# Patient Record
Sex: Female | Born: 1935 | Race: White | Hispanic: No
Health system: Southern US, Community
[De-identification: ages and names within clinical notes are randomized; demographics above are authoritative.]

---

## 2006-07-12 ENCOUNTER — Ambulatory Visit: Payer: Self-pay | Admitting: Unknown Physician Specialty

## 2006-07-13 ENCOUNTER — Ambulatory Visit: Payer: Self-pay | Admitting: Unknown Physician Specialty

## 2007-09-07 ENCOUNTER — Ambulatory Visit: Payer: Self-pay | Admitting: Unknown Physician Specialty

## 2007-09-14 ENCOUNTER — Emergency Department: Payer: Self-pay | Admitting: Emergency Medicine

## 2008-12-18 ENCOUNTER — Ambulatory Visit: Payer: Self-pay | Admitting: Unknown Physician Specialty

## 2009-01-03 ENCOUNTER — Ambulatory Visit: Payer: Self-pay | Admitting: Unknown Physician Specialty

## 2009-06-18 ENCOUNTER — Ambulatory Visit: Payer: Self-pay | Admitting: Unknown Physician Specialty

## 2009-10-21 ENCOUNTER — Ambulatory Visit: Payer: Self-pay | Admitting: Specialist

## 2009-12-10 ENCOUNTER — Ambulatory Visit: Payer: Self-pay | Admitting: Unknown Physician Specialty

## 2010-02-02 ENCOUNTER — Emergency Department: Payer: Self-pay | Admitting: Emergency Medicine

## 2010-03-28 ENCOUNTER — Inpatient Hospital Stay: Payer: Self-pay | Admitting: Orthopedic Surgery

## 2010-03-28 ENCOUNTER — Other Ambulatory Visit: Payer: Self-pay | Admitting: Orthopedic Surgery

## 2010-04-06 ENCOUNTER — Emergency Department: Payer: Self-pay | Admitting: Emergency Medicine

## 2010-06-01 ENCOUNTER — Ambulatory Visit: Payer: Self-pay | Admitting: Internal Medicine

## 2010-06-28 ENCOUNTER — Inpatient Hospital Stay: Payer: Self-pay | Admitting: Orthopedic Surgery

## 2010-07-02 ENCOUNTER — Ambulatory Visit: Payer: Self-pay | Admitting: Internal Medicine

## 2010-12-27 ENCOUNTER — Ambulatory Visit: Payer: Self-pay

## 2011-02-16 ENCOUNTER — Emergency Department: Payer: Self-pay | Admitting: Unknown Physician Specialty

## 2011-02-23 ENCOUNTER — Ambulatory Visit: Payer: Self-pay | Admitting: Unknown Physician Specialty

## 2011-03-23 ENCOUNTER — Inpatient Hospital Stay: Payer: Self-pay | Admitting: *Deleted

## 2011-04-30 ENCOUNTER — Ambulatory Visit: Payer: Self-pay | Admitting: Ophthalmology

## 2011-05-10 ENCOUNTER — Ambulatory Visit: Payer: Self-pay | Admitting: Ophthalmology

## 2012-02-29 ENCOUNTER — Emergency Department: Payer: Self-pay | Admitting: *Deleted

## 2012-02-29 LAB — COMPREHENSIVE METABOLIC PANEL
Albumin: 4.1 g/dL (ref 3.4–5.0)
Alkaline Phosphatase: 106 U/L (ref 50–136)
Anion Gap: 6 — ABNORMAL LOW (ref 7–16)
BUN: 14 mg/dL (ref 7–18)
Bilirubin,Total: 0.4 mg/dL (ref 0.2–1.0)
Co2: 30 mmol/L (ref 21–32)
Creatinine: 0.64 mg/dL (ref 0.60–1.30)
Potassium: 4.5 mmol/L (ref 3.5–5.1)
SGOT(AST): 23 U/L (ref 15–37)
Sodium: 138 mmol/L (ref 136–145)
Total Protein: 7.4 g/dL (ref 6.4–8.2)

## 2012-02-29 LAB — CBC
HGB: 14.3 g/dL (ref 12.0–16.0)
MCHC: 33.7 g/dL (ref 32.0–36.0)
MCV: 114 fL — ABNORMAL HIGH (ref 80–100)
RBC: 3.72 10*6/uL — ABNORMAL LOW (ref 3.80–5.20)
WBC: 9.6 10*3/uL (ref 3.6–11.0)

## 2012-03-01 ENCOUNTER — Ambulatory Visit: Payer: Self-pay | Admitting: Oncology

## 2012-03-07 LAB — COMPREHENSIVE METABOLIC PANEL
Albumin: 3.7 g/dL (ref 3.4–5.0)
Alkaline Phosphatase: 88 U/L (ref 50–136)
Bilirubin,Total: 0.4 mg/dL (ref 0.2–1.0)
Calcium, Total: 9.5 mg/dL (ref 8.5–10.1)
Creatinine: 0.49 mg/dL — ABNORMAL LOW (ref 0.60–1.30)
EGFR (African American): >60
EGFR (Non-African Amer.): >60
Glucose: 81 mg/dL (ref 65–99)
Osmolality: 282 (ref 275–301)
Potassium: 4.5 mmol/L (ref 3.5–5.1)
SGPT (ALT): 22 U/L
Sodium: 142 mmol/L (ref 136–145)

## 2012-03-07 LAB — URINALYSIS, COMPLETE
Bilirubin,UR: NEGATIVE
Blood: NEGATIVE
Glucose,UR: NEGATIVE mg/dL (ref 0–75)
Ketone: NEGATIVE
Ph: 8 (ref 4.5–8.0)
Protein: NEGATIVE
Squamous Epithelial: <1
WBC UR: 5 /HPF (ref 0–5)

## 2012-03-07 LAB — CBC
HCT: 38.4 % (ref 35.0–47.0)
HGB: 13 g/dL (ref 12.0–16.0)
MCH: 39 pg — ABNORMAL HIGH (ref 26.0–34.0)
MCHC: 34 g/dL (ref 32.0–36.0)
Platelet: 207 10*3/uL (ref 150–440)
RDW: 12.8 % (ref 11.5–14.5)

## 2012-03-07 LAB — TROPONIN I: Troponin-I: <0.02 ng/mL

## 2012-03-07 LAB — LIPASE, BLOOD: Lipase: 224 U/L (ref 73–393)

## 2012-03-08 ENCOUNTER — Observation Stay: Payer: Self-pay | Admitting: Internal Medicine

## 2012-03-09 LAB — CBC WITH DIFFERENTIAL/PLATELET
Basophil #: 0.2 10*3/uL — ABNORMAL HIGH (ref 0.0–0.1)
Basophil %: 2 %
Eosinophil #: 0.3 10*3/uL (ref 0.0–0.7)
HCT: 34.4 % — ABNORMAL LOW (ref 35.0–47.0)
Lymphocyte %: 30.5 %
MCV: 116 fL — ABNORMAL HIGH (ref 80–100)
Monocyte %: 8 %
Neutrophil #: 5 10*3/uL (ref 1.4–6.5)
Neutrophil %: 55.9 %
Platelet: 170 10*3/uL (ref 150–440)
RDW: 12.9 % (ref 11.5–14.5)
WBC: 9 10*3/uL (ref 3.6–11.0)

## 2012-03-09 LAB — BASIC METABOLIC PANEL
BUN: 4 mg/dL — ABNORMAL LOW (ref 7–18)
Calcium, Total: 8.3 mg/dL — ABNORMAL LOW (ref 8.5–10.1)
Co2: 25 mmol/L (ref 21–32)
EGFR (African American): >60
EGFR (Non-African Amer.): >60
Osmolality: 281 (ref 275–301)
Potassium: 3.9 mmol/L (ref 3.5–5.1)

## 2012-04-01 ENCOUNTER — Ambulatory Visit: Payer: Self-pay | Admitting: Oncology

## 2012-04-18 ENCOUNTER — Ambulatory Visit: Payer: Self-pay | Admitting: Unknown Physician Specialty

## 2012-05-10 ENCOUNTER — Emergency Department: Payer: Self-pay | Admitting: Internal Medicine

## 2012-05-10 LAB — BASIC METABOLIC PANEL
Anion Gap: 10 (ref 7–16)
Calcium, Total: 9.5 mg/dL (ref 8.5–10.1)
Co2: 27 mmol/L (ref 21–32)
Creatinine: 0.65 mg/dL (ref 0.60–1.30)
EGFR (African American): >60
EGFR (Non-African Amer.): >60
Glucose: 82 mg/dL (ref 65–99)

## 2012-05-10 LAB — CBC
HGB: 12.3 g/dL (ref 12.0–16.0)
MCH: 39.1 pg — ABNORMAL HIGH (ref 26.0–34.0)
MCHC: 32.9 g/dL (ref 32.0–36.0)
MCV: 119 fL — ABNORMAL HIGH (ref 80–100)
Platelet: 226 10*3/uL (ref 150–440)
RBC: 3.15 10*6/uL — ABNORMAL LOW (ref 3.80–5.20)
RDW: 14.1 % (ref 11.5–14.5)
WBC: 9.5 10*3/uL (ref 3.6–11.0)

## 2012-05-10 LAB — PROTIME-INR
INR: 0.9
Prothrombin Time: 12.9 secs (ref 11.5–14.7)

## 2012-05-10 LAB — HEPATIC FUNCTION PANEL A (ARMC)
Albumin: 3.6 g/dL (ref 3.4–5.0)
SGOT(AST): 30 U/L (ref 15–37)

## 2012-11-01 DEATH — deceased
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
## 2015-02-23 NOTE — Consult Note (Signed)
Brief Consult Note: Diagnosis: Three week history of nausea and upper and mid abdomen.  Abnormal GI X-ray report with finding of focal circumferential thickening of the body of the stomach.  Malnutrition.  History of pulmonary nodules with noted increase in size.   Consult note dictated.   Discussed with Attending MD.   Comments: Patient's presentation discussed with Dr. Lutricia FeilPaul Oh.  Will proceed with EGD tomorrow to allow direct luminal evalaution of upper GI tract with biopsies to be obtained.  Procedure explained to patient as well as her sister inclusive of risks vs benefits.  Posted for tomorrow.  NPO after midnight.  Continuation with PPI therapy.  Pending oncology evaluation.  Electronic Signatures: Rodman KeyHarrison, Dawn S (NP)  (Signed [id number] 228908-May-13 14:58)  Authored: Brief Consult Note   Last Updated: 08-May-13 14:58 by Rodman KeyHarrison, Dawn S (NP)

## 2015-02-23 NOTE — Discharge Summary (Signed)
PATIENT NAME:  Jaime Haynes, Shiana T MR#:  [id number] DATE OF BIRTH:  29-Mar-1936  DATE OF ADMISSION:  03/08/2012 DATE OF DISCHARGE:  03/10/2012  ADDENDUM:  Please see the discharge summary from Mar 09, 2012.   The reason why the patient was not discharged on 03/09/2012 was that the family was very upset about the patient being home alone and the endoscopy was just done in the late afternoon. They felt the patient was too sedated to go home and felt better if the patient stayed here overnight. No change in the patient's condition or medical issues. The patient discharged home with the same medications.   TIME SPENT: Time spent on discharge was less than 30 minutes.    ____________________________ Herschell Dimesichard J. Renae GlossWieting, MD rjw:rbg D: 03/10/2012 16:08:02 ET T: 03/13/2012 12:48:40 ET JOB#: [id number]  cc: Herschell Dimesichard J. Renae GlossWieting, MD, <Dictator> Yetta FlockAileen H. Miller, MD Salley ScarletICHARD J Justun Anaya MD ELECTRONICALLY SIGNED 03/19/2012 14:41
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
## 2015-02-23 NOTE — H&P (Signed)
PATIENT NAME:  Jaime Haynes, Jaime Haynes MR#:  [id number] DATE OF BIRTH:  1936-08-09  DATE OF ADMISSION:  03/08/2012  PRIMARY CARE PHYSICIAN:   REFERRING PHYSICIAN: Joseph ArtAlice Finnell, MD  CHIEF COMPLAINT:   HISTORY OF PRESENT ILLNESS:  ____________________________ Starleen Armsawood S. Elgergawy, MD dse:slb D: 03/08/2012 01:00:04 ET Haynes: 03/08/2012 08:01:38 ET JOB#: [id number]  cc: Starleen Armsawood S. Elgergawy, MD, <Dictator> DAWOOD Teena IraniS ELGERGAWY MD ELECTRONICALLY SIGNED 03/08/2012 8:40

## 2015-02-23 NOTE — Discharge Summary (Signed)
PATIENT NAME:  Jaime Haynes, Jaime Haynes MR#:  [id number] DATE OF BIRTH:  1936-06-13  DATE OF ADMISSION:  03/08/2012 DATE OF DISCHARGE:    PRIMARY CARE PHYSICIAN:  Dr. Silver Huguenin   FINAL DIAGNOSES:  1. Abdominal pain with nausea and vomiting.  2. Lung nodule.  3. Chronic obstructive pulmonary disease with tobacco abuse.  4. Chronic pain.  5. Anxiety.   MEDICATIONS ON DISCHARGE:  1. Zofran 4 mg every eight hours as needed for nausea, vomiting. 2. Percocet 1 tablet 5/325 every eight hours as needed for pain.  3. Lorazepam 0.5 mg twice a day.  4. TUMS 1 tablet twice a day.  5. Multivitamin 1 tablet daily.  6. Colace 100 mg daily.  7. Omeprazole 20 mg daily.  8. Symbicort 160/4.5, 2 puffs twice a day.   DISCHARGE INSTRUCTIONS: Do not smoke cigarettes. Do not take soda.   ACTIVITY: As tolerated.   DIET: Regular diet. Resume [employer] and Meals on Wheels.   FOLLOWUP:  1. Follow up with Dr. Meredeth Ide in July.  2. Follow up in 1 to 2 weeks with Dr. Silver Huguenin.   REASON FOR ADMISSION: The patient was admitted 03/08/2012 and discharged 03/09/2012. The patient came in with abdominal pain and nausea.   HISTORY OF PRESENT ILLNESS: This is a 79 year old female with past medical history of chronic obstructive pulmonary disease. She came in with abdominal pain and nausea going on for the last few weeks. She had a CT scan of the abdomen and pelvis in the Emergency Room that showed thickening of the body of the stomach as well as new pulmonary nodules. The patient was admitted for observation. Consults by Dr. Meredeth Ide and Dr. Bluford Kaufmann of gastroenterology were ordered.   LABORATORY AND RADIOLOGICAL DATA: Troponin was negative. Lipase 224. White blood cell count 9, hemoglobin and hematocrit 13 and 38.4, platelet count 207, MCV 115, glucose 81, BUN 13, creatinine 0.49, sodium 142, potassium 4.5, chloride 105, CO2 31, calcium 9.5. Liver function tests normal. EKG: Normal sinus rhythm, no acute ST-T wave  changes. Urinalysis showed 1+ leukocyte esterase, 1+ bacteria.  CT scan of the abdomen and pelvis showed no acute findings in the abdomen and pelvis. Focal circumferential thickening of the body of the stomach likely related to under distention and peristalsis. Other etiologies such as malignancy not excluded. Multiple indeterminant pulmonary nodules, the largest 7 mm and new from prior CT scan of the chest. Mucus plugging in bronchi of the lower lobes. Chest x-ray showed biapical pleural thickening consistent with scarring, atelectasis left lung base, chronic obstructive pulmonary disease. Ultrasound of the abdomen showed a normal appearing gallbladder and common bile duct, poor visualization of the pancreas. Endoscopy was done by Dr. Bluford Kaufmann on 03/09/2012. It showed a normal esophagus, normal stomach, and normal duodenum.   HOSPITAL COURSE PER PROBLEM LIST:  1. Abdominal pain, nausea, and vomiting: The patient had a negative CT scan and negative ultrasound of the abdomen and a negative endoscopy. The patient was placed back on a diet and was able to tolerate it. She will be sent home with followup with Dr. Silver Huguenin. She has p.r.n. Zofran. I advised her to cut back on her soda consumption, which could be causing the pain.  2. Lung nodules: Dr. Meredeth Ide saw her in consultation. She does have a followup in July with him. A dedicated CT scan of the chest at that time will be indicated.  3. Chronic obstructive pulmonary disease with tobacco abuse: Smoking cessation counseling done for  three minutes by me. The patient does not want to stop smoking.  4. Chronic pain: The patient is on Percocet, which could be part of the patient's nausea issue.  5. Anxiety: She is on lorazepam.       TIME SPENT ON DISCHARGE: 35 minutes.    ____________________________ Serena Petterson J. Renae GlossWieting, MD rjw:bjtHerschell Dimes D: 03/09/2012 17:01:32 ET T: 03/10/2012 10:53:14 ET JOB#: [id number]  cc: Yetta FlockAileen H. Miller, MD Salley ScarletICHARD J Alandis Bluemel  MD ELECTRONICALLY SIGNED 03/13/2012 12:53

## 2015-02-23 NOTE — Consult Note (Signed)
EGD completely normal. Low residue diet ordered. Pt wants to go home tonight.However, there is no family to watch her tonight. If tolerates solids, ok for discharge tomorrow from GI point of view. Will sign off. Thanks.   Electronic Signatures: Lutricia Feilh, Lauri Purdum (MD) (Signed on 09-May-13 16:12)  Authored   Last Updated: 09-May-13 16:50 by Lutricia Feilh, Darneshia Demary (MD)

## 2015-02-23 NOTE — Consult Note (Signed)
Pt seen and examined. See Dawn Harrison's notes. Pt with abd pain, nausea, and abnormal CT of stomach. Plan EGD tomorrow. Thanks.   Electronic Signatures: Lutricia Feilh, Malik Paar (MD)  (Signed on 08-May-13 18:13)  Authored  Last Updated: 08-May-13 18:13 by Lutricia Feilh, Ajene Carchi (MD)
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
